# Patient Record
Sex: Male | Born: 1990 | Race: Asian | Hispanic: No | Marital: Single | Smoking: Never smoker
Health system: Southern US, Community
[De-identification: ages and names within clinical notes are randomized; demographics above are authoritative.]

---

## 2014-09-14 ENCOUNTER — Encounter (HOSPITAL_BASED_OUTPATIENT_CLINIC_OR_DEPARTMENT_OTHER): Payer: Self-pay | Admitting: *Deleted

## 2014-09-14 ENCOUNTER — Emergency Department (HOSPITAL_BASED_OUTPATIENT_CLINIC_OR_DEPARTMENT_OTHER)
Admission: EM | Admit: 2014-09-14 | Discharge: 2014-09-14 | Disposition: A | Discharge status: Home / Self Care | Payer: 59 | Attending: Emergency Medicine | Admitting: Emergency Medicine

## 2014-09-14 ENCOUNTER — Emergency Department (HOSPITAL_BASED_OUTPATIENT_CLINIC_OR_DEPARTMENT_OTHER): Payer: 59

## 2014-09-14 DIAGNOSIS — R112 Nausea with vomiting, unspecified: Secondary | ICD-10-CM | POA: Insufficient documentation

## 2014-09-14 DIAGNOSIS — R109 Unspecified abdominal pain: Secondary | ICD-10-CM | POA: Diagnosis present

## 2014-09-14 DIAGNOSIS — N200 Calculus of kidney: Secondary | ICD-10-CM

## 2014-09-14 DIAGNOSIS — N132 Hydronephrosis with renal and ureteral calculous obstruction: Secondary | ICD-10-CM | POA: Insufficient documentation

## 2014-09-14 LAB — BASIC METABOLIC PANEL
Anion gap: 9 (ref 5–15)
BUN: 12 mg/dL (ref 6–20)
CO2: 26 mmol/L (ref 22–32)
Calcium: 9.2 mg/dL (ref 8.9–10.3)
Chloride: 103 mmol/L (ref 101–111)
Creatinine, Ser: 1.13 mg/dL (ref 0.61–1.24)
GFR calc non Af Amer: >60 mL/min (ref >60)
GLUCOSE: 128 mg/dL — AB (ref 65–99)
Potassium: 4.1 mmol/L (ref 3.5–5.1)
Sodium: 138 mmol/L (ref 135–145)

## 2014-09-14 LAB — URINALYSIS, ROUTINE W REFLEX MICROSCOPIC
GLUCOSE, UA: NEGATIVE mg/dL
KETONES UR: 15 mg/dL — AB
Leukocytes, UA: NEGATIVE
NITRITE: NEGATIVE
Protein, ur: NEGATIVE mg/dL
Specific Gravity, Urine: 1.036 — ABNORMAL HIGH (ref 1.005–1.030)
UROBILINOGEN UA: 0.2 mg/dL (ref 0.0–1.0)
pH: 6 (ref 5.0–8.0)

## 2014-09-14 LAB — URINE MICROSCOPIC-ADD ON

## 2014-09-14 MED ORDER — OXYCODONE-ACETAMINOPHEN 5-325 MG PO TABS: 1.0000 | ORAL_TABLET | ORAL | Status: AC | PRN

## 2014-09-14 MED ORDER — KETOROLAC TROMETHAMINE 30 MG/ML IJ SOLN
30.0000 mg | Freq: Once | INTRAMUSCULAR | Status: AC
  Administered 2014-09-14: 30 mg via INTRAVENOUS
  Filled 2014-09-14: qty 1

## 2014-09-14 MED ORDER — TAMSULOSIN HCL 0.4 MG PO CAPS: 0.4000 mg | ORAL_CAPSULE | Freq: Every day | ORAL | Status: AC

## 2014-09-14 MED ORDER — OXYCODONE-ACETAMINOPHEN 5-325 MG PO TABS
1.0000 | ORAL_TABLET | Freq: Once | ORAL | Status: AC
  Administered 2014-09-14: 1 via ORAL
  Filled 2014-09-14: qty 1

## 2014-09-14 MED ORDER — ONDANSETRON 4 MG PO TBDP: ORAL_TABLET | ORAL | Status: AC

## 2014-09-14 NOTE — ED Notes (Signed)
D/c delayed d/t "consulting urology".

## 2014-09-14 NOTE — Discharge Instructions (Signed)

## 2014-09-14 NOTE — ED Provider Notes (Signed)
CSN: [id number]     Arrival date & time 09/14/14  1942 History  This chart was scribed for Travis BuccoMelanie Millan Legan, MD by Octavia HeirArianna Fisher, ED Scribe. This patient was seen in room MH01/MH01 and the patient's care was started at 8:48 PM.     Chief Complaint  Patient presents with  . Abdominal Pain      The history is provided by the patient. No language interpreter was used.    HPI Comments: Travis EaringChiragkumar Fisher is a 24 y.o. male who presents to the Emergency Department complaining of constant, gradual worsening abdominal pain onset 4 hours ago. Pt has associated difficulty urinating with urgency and nausea/vomiting. Pt reports having similar symptoms one week ago and went to urgent care and his tests came back negative. He denies hematuria.  History reviewed. No pertinent past medical history. History reviewed. No pertinent past surgical history. No family history on file. History  Substance Use Topics  . Smoking status: Never Smoker   . Smokeless tobacco: Not on file  . Alcohol Use: No    Review of Systems  Constitutional: Negative for fever, chills, diaphoresis and fatigue.  HENT: Negative for congestion, rhinorrhea and sneezing.   Eyes: Negative.   Respiratory: Negative for cough, chest tightness and shortness of breath.   Cardiovascular: Negative for chest pain and leg swelling.  Gastrointestinal: Positive for nausea, vomiting and abdominal pain. Negative for diarrhea and blood in stool.  Genitourinary: Positive for flank pain. Negative for frequency, hematuria and difficulty urinating.  Musculoskeletal: Positive for back pain. Negative for arthralgias.  Skin: Negative for rash.  Neurological: Negative for dizziness, speech difficulty, weakness, numbness and headaches.      Allergies  Review of patient's allergies indicates no known allergies.  Home Medications   Prior to Admission medications   Medication Sig Start Date End Date Taking? Authorizing Provider  ondansetron (ZOFRAN  ODT) 4 MG disintegrating tablet 4mg  ODT q4 hours prn nausea/vomit 09/14/14   Travis BuccoMelanie Daniyal Tabor, MD  oxyCODONE-acetaminophen (PERCOCET) 5-325 MG per tablet Take 1-2 tablets by mouth every 4 (four) hours as needed. 09/14/14   Travis BuccoMelanie Allayna Erlich, MD  tamsulosin (FLOMAX) 0.4 MG CAPS capsule Take 1 capsule (0.4 mg total) by mouth daily. 09/14/14   Travis BuccoMelanie Deondrae Mcgrail, MD   Triage vitals: BP 123/93 mmHg  Pulse 52  Temp(Src) 98.5 F (36.9 C) (Oral)  Resp 23  Ht 5\' 9"  (1.753 m)  Wt 138 lb (62.596 kg)  BMI 20.37 kg/m2  SpO2 100% Physical Exam  Constitutional: He is oriented to person, place, and time. He appears well-developed and well-nourished.  Patient appears uncomfortable and is pacing around in the room  HENT:  Head: Normocephalic and atraumatic.  Eyes: Pupils are equal, round, and reactive to light.  Neck: Normal range of motion. Neck supple.  Cardiovascular: Normal rate, regular rhythm and normal heart sounds.   Pulmonary/Chest: Effort normal and breath sounds normal. No respiratory distress. He has no wheezes. He has no rales. He exhibits no tenderness.  Abdominal: Soft. Bowel sounds are normal. There is tenderness (Moderate tenderness to the right midabdomen flank). There is no rebound and no guarding.  Musculoskeletal: Normal range of motion. He exhibits no edema.  Lymphadenopathy:    He has no cervical adenopathy.  Neurological: He is alert and oriented to person, place, and time.  Skin: Skin is warm and dry. No rash noted.  Psychiatric: He has a normal mood and affect.    ED Course  Procedures  DIAGNOSTIC STUDIES: Oxygen Saturation is 100% on  RA, normal by my interpretation.  COORDINATION OF CARE: 8:51 PM Discussed treatment plan which includes Toradol with pt at bedside and pt agreed to plan.  Labs Review Labs Reviewed  URINALYSIS, ROUTINE W REFLEX MICROSCOPIC (NOT AT [facility]) - Abnormal; Notable for the following:    Color, Urine AMBER (*)    APPearance CLOUDY (*)    Specific Gravity, Urine  1.036 (*)    Hgb urine dipstick LARGE (*)    Bilirubin Urine SMALL (*)    Ketones, ur 15 (*)    All other components within normal limits  URINE MICROSCOPIC-ADD ON - Abnormal; Notable for the following:    Bacteria, UA MANY (*)    All other components within normal limits  BASIC METABOLIC PANEL - Abnormal; Notable for the following:    Glucose, Bld 128 (*)    All other components within normal limits    Imaging Review Ct Renal Stone Study  09/14/2014   CLINICAL DATA:  Right flank pain for 4 hours.  EXAM: CT ABDOMEN AND PELVIS WITHOUT CONTRAST  TECHNIQUE: Multidetector CT imaging of the abdomen and pelvis was performed following the standard protocol without IV contrast.  COMPARISON:  None.  FINDINGS: The included lung bases are clear.  There is an obstructing 7 x 4 mm stone at the right ureterovesicular junction with moderate resultant hydroureteronephrosis. Minimal perinephric stranding. No definite additional nonobstructing stone in either kidney. Left kidney appears normal.  Evaluation of the remaining solid and hollow viscera is limited given lack of contrast. The unenhanced liver, spleen, pancreas, and adrenal glands are unremarkable.  Stomach is decompressed. There are no dilated or thickened bowel loops. The appendix is normal. Small volume of colonic stool without colonic wall thickening.  No retroperitoneal adenopathy. Abdominal aorta is normal in caliber.  Urinary bladder is near completely decompressed. No pelvic free fluid.  There are no acute or suspicious osseous abnormalities. Small scattered bone islands.  IMPRESSION: Obstructing 7 x 4 mm stone at the right ureterovesicular junction with resultant moderate hydroureteronephrosis.   Electronically Signed   By: Rubye Oaks M.D.   On: 09/14/2014 21:06     EKG Interpretation None      MDM   Final diagnoses:  Kidney stone    Patient with a large distal ureteral stone. He has moderate hydronephrosis. His creatinine is normal  but at the upper limits of normal. There is no evidence of infection. He was completely pain-free after 1 dose of Toradol. I spoke with Dr. Isabel Caprice who is fine with the patient being discharged home.  He was given prescription for Percocet Zofran and Flomax. He was also advised that he can use ibuprofen at home for symptomatic relief. He was advised that he does need close follow-up with urology. He was given a referral to [facility] and advised to call them in the morning for an appointment within the next few days.  He was given strict return precautions and advised to go to [facility] the ED if his symptoms worsen.  I personally performed the services described in this documentation, which was scribed in my presence.  The recorded information has been reviewed and considered.   Travis Bucco, MD 09/14/14 [phone]

## 2014-09-14 NOTE — ED Notes (Signed)
Pt here with right flank pain as well as vomiting which began around 4:30pm.  Pt is having dry heaves in triage

## 2017-05-04 ENCOUNTER — Other Ambulatory Visit: Payer: Self-pay

## 2017-05-04 ENCOUNTER — Encounter (HOSPITAL_BASED_OUTPATIENT_CLINIC_OR_DEPARTMENT_OTHER): Payer: Self-pay | Admitting: *Deleted

## 2017-05-04 ENCOUNTER — Emergency Department (HOSPITAL_BASED_OUTPATIENT_CLINIC_OR_DEPARTMENT_OTHER): Payer: BLUE CROSS/BLUE SHIELD

## 2017-05-04 ENCOUNTER — Emergency Department (HOSPITAL_BASED_OUTPATIENT_CLINIC_OR_DEPARTMENT_OTHER)
Admission: EM | Admit: 2017-05-04 | Discharge: 2017-05-04 | Disposition: A | Discharge status: Home / Self Care | Payer: BLUE CROSS/BLUE SHIELD | Attending: Emergency Medicine | Admitting: Emergency Medicine

## 2017-05-04 DIAGNOSIS — L03316 Cellulitis of umbilicus: Secondary | ICD-10-CM | POA: Insufficient documentation

## 2017-05-04 DIAGNOSIS — L0390 Cellulitis, unspecified: Secondary | ICD-10-CM

## 2017-05-04 DIAGNOSIS — R1031 Right lower quadrant pain: Secondary | ICD-10-CM

## 2017-05-04 LAB — CBC WITH DIFFERENTIAL/PLATELET
Basophils Absolute: 0 10*3/uL (ref 0.0–0.1)
Basophils Relative: 0 %
Eosinophils Absolute: 0.3 10*3/uL (ref 0.0–0.7)
Eosinophils Relative: 5 %
HCT: 42.3 % (ref 39.0–52.0)
Hemoglobin: 14.5 g/dL (ref 13.0–17.0)
Lymphocytes Relative: 27 %
Lymphs Abs: 1.6 10*3/uL (ref 0.7–4.0)
MCH: 28.7 pg (ref 26.0–34.0)
MCHC: 34.3 g/dL (ref 30.0–36.0)
MCV: 83.8 fL (ref 78.0–100.0)
Monocytes Absolute: 0.4 10*3/uL (ref 0.1–1.0)
Monocytes Relative: 8 %
Neutro Abs: 3.6 10*3/uL (ref 1.7–7.7)
Neutrophils Relative %: 60 %
Platelets: 206 10*3/uL (ref 150–400)
RBC: 5.05 MIL/uL (ref 4.22–5.81)
RDW: 12.9 % (ref 11.5–15.5)
WBC: 5.9 10*3/uL (ref 4.0–10.5)

## 2017-05-04 LAB — COMPREHENSIVE METABOLIC PANEL
ALT: 14 U/L — ABNORMAL LOW (ref 17–63)
AST: 21 U/L (ref 15–41)
Albumin: 4.4 g/dL (ref 3.5–5.0)
Alkaline Phosphatase: 60 U/L (ref 38–126)
Anion gap: 9 (ref 5–15)
BUN: 9 mg/dL (ref 6–20)
CO2: 27 mmol/L (ref 22–32)
Calcium: 9.4 mg/dL (ref 8.9–10.3)
Chloride: 100 mmol/L — ABNORMAL LOW (ref 101–111)
Creatinine, Ser: 1.01 mg/dL (ref 0.61–1.24)
GFR calc Af Amer: >60 mL/min (ref >60)
GFR calc non Af Amer: >60 mL/min (ref >60)
Glucose, Bld: 106 mg/dL — ABNORMAL HIGH (ref 65–99)
Potassium: 4.1 mmol/L (ref 3.5–5.1)
Sodium: 136 mmol/L (ref 135–145)
Total Bilirubin: 0.6 mg/dL (ref 0.3–1.2)
Total Protein: 7.6 g/dL (ref 6.5–8.1)

## 2017-05-04 LAB — URINALYSIS, ROUTINE W REFLEX MICROSCOPIC
Bilirubin Urine: NEGATIVE
Glucose, UA: NEGATIVE mg/dL
Hgb urine dipstick: NEGATIVE
Ketones, ur: NEGATIVE mg/dL
Leukocytes, UA: NEGATIVE
Nitrite: NEGATIVE
Protein, ur: NEGATIVE mg/dL
Specific Gravity, Urine: <1.005 — ABNORMAL LOW (ref 1.005–1.030)
pH: 7 (ref 5.0–8.0)

## 2017-05-04 LAB — LIPASE, BLOOD: Lipase: 32 U/L (ref 11–51)

## 2017-05-04 MED ORDER — IOPAMIDOL (ISOVUE-300) INJECTION 61%
100.0000 mL | Freq: Once | INTRAVENOUS | Status: AC | PRN
  Administered 2017-05-04: 100 mL via INTRAVENOUS

## 2017-05-04 NOTE — ED Provider Notes (Signed)
[facility] EMERGENCY DEPARTMENT Provider Note   CSN: [id number] Arrival date & time: 05/04/17  1300   History   Chief Complaint Chief Complaint  Patient presents with  . Abdominal Pain    HPI Travis Fisher is a 27 y.o. male.  HPI   27 year old male presents today with complaints of right lower quadrant abdominal pain.  Patient reports a several week history of redness around his umbilicus.  He notes there was some edema in that area as well, but had no significant pain.  He was seen in urgent care yesterday and started on Keflex.  He notes the redness has improved but has developed right lower quadrant abdominal pain.  He notes that symptoms are made worse with palpation or movement, but consistently present.  Patient denies any fever, nausea vomiting.  He denies any urinary changes, testicular pain or change in bowel characteristics or frequency; no testicular pain.  Patient has not taken any medications for pain prior to arrival.  He notes eating some crackers and tea this morning.   History reviewed. No pertinent past medical history.  There are no active problems to display for this patient.   History reviewed. No pertinent surgical history.     Home Medications    Prior to Admission medications   Medication Sig Start Date End Date Taking? Authorizing Provider  cephALEXin (KEFLEX) 250 MG capsule Take by mouth 4 (four) times daily.   Yes [provider]  ondansetron (ZOFRAN ODT) 4 MG disintegrating tablet 4mg  ODT q4 hours prn nausea/vomit 09/14/14   Rolan BuccoBelfi, Melanie, MD  oxyCODONE-acetaminophen (PERCOCET) 5-325 MG per tablet Take 1-2 tablets by mouth every 4 (four) hours as needed. 09/14/14   Rolan BuccoBelfi, Melanie, MD  tamsulosin (FLOMAX) 0.4 MG CAPS capsule Take 1 capsule (0.4 mg total) by mouth daily. 09/14/14   Rolan BuccoBelfi, Melanie, MD    Family History No family history on file.  Social History Social History   Tobacco Use  . Smoking status: Never Smoker  .  Smokeless tobacco: Never Used  Substance Use Topics  . Alcohol use: No  . Drug use: Not on file     Allergies   Patient has no known allergies.   Review of Systems Review of Systems  All other systems reviewed and are negative.    Physical Exam Updated Vital Signs BP 108/66 (BP Location: Left Arm)   Pulse 63   Temp 97.8 F (36.6 C) (Oral)   Resp 18   Ht 5\' 10"  (1.778 m)   Wt 69.9 kg (154 lb)   SpO2 99%   BMI 22.10 kg/m   Physical Exam  Constitutional: He is oriented to person, place, and time. He appears well-developed and well-nourished.  HENT:  Head: Normocephalic and atraumatic.  Eyes: Conjunctivae are normal. Pupils are equal, round, and reactive to light. Right eye exhibits no discharge. Left eye exhibits no discharge. No scleral icterus.  Neck: Normal range of motion. No JVD present. No tracheal deviation present.  Pulmonary/Chest: Effort normal. No stridor.  Abdominal:  Small amount of redness in umbilicus no surrounding edema or discharge, non tender- remained of abd exam normal without tenderness   Neurological: He is alert and oriented to person, place, and time. Coordination normal.  Psychiatric: He has a normal mood and affect. His behavior is normal. Judgment and thought content normal.  Nursing note and vitals reviewed.    ED Treatments / Results  Labs (all labs ordered are listed, but only abnormal results are displayed) Labs  Reviewed  COMPREHENSIVE METABOLIC PANEL - Abnormal; Notable for the following components:      Result Value   Chloride 100 (*)    Glucose, Bld 106 (*)    ALT 14 (*)    All other components within normal limits  URINALYSIS, ROUTINE W REFLEX MICROSCOPIC - Abnormal; Notable for the following components:   Specific Gravity, Urine <1.005 (*)    All other components within normal limits  CBC WITH DIFFERENTIAL/PLATELET  LIPASE, BLOOD    EKG  EKG Interpretation None       Radiology Ct Abdomen Pelvis W  Contrast  Result Date: 05/04/2017 CLINICAL DATA:  Lower abdominal pain and periumbilical swelling EXAM: CT ABDOMEN AND PELVIS WITH CONTRAST TECHNIQUE: Multidetector CT imaging of the abdomen and pelvis was performed using the standard protocol following bolus administration of intravenous contrast. Oral contrast was also administered. CONTRAST:  ISOVUE-300 IOPAMIDOL (ISOVUE-300) INJECTION 61% COMPARISON:  CT abdomen and pelvis September 14, 2014 FINDINGS: Lower chest: Lung bases are clear. Hepatobiliary: No focal liver lesions are evident. Gallbladder wall is not appreciably thickened. There is no biliary duct dilatation. Pancreas: No pancreatic mass or inflammatory focus. Spleen: No splenic lesions are evident. Adrenals/Urinary Tract: Adrenals bilaterally appear normal. Kidneys bilaterally show no appreciable mass or hydronephrosis on either side. There is an extrarenal pelvis on the left, an anatomic variant. No renal or ureteral calculus is appreciable. Urinary bladder is midline with wall thickness within normal limits. Stomach/Bowel: There is no appreciable bowel wall or mesenteric thickening. No evident bowel obstruction. No free air or portal venous air. Vascular/Lymphatic: There is no abdominal aortic aneurysm. No vascular lesions are evident in the abdomen or pelvis. There is no appreciable adenopathy in the abdomen or pelvis. Reproductive: Prostate and seminal vesicles appear normal in size and contour. No evident pelvic mass. Other: Appendix appears normal. No abscess or ascites is evident in the abdomen or pelvis. There is no periumbilical region lesion evident. Musculoskeletal: There are no blastic or lytic bone lesions. There is no intramuscular or abdominal wall lesion evident. IMPRESSION: 1. Appendix appears normal. No bowel obstruction. No abscess in the abdomen or pelvis. 2. No demonstrable renal or ureteral calculus on either side. No hydronephrosis. No urinary bladder wall thickening. 3.  No  periumbilical region lesion. 4. A cause for patient's symptoms has not been established with this study. Electronically Signed   By: Bretta Bang III M.D.   On: 05/04/2017 14:58    Procedures Procedures (including critical care time)  Medications Ordered in ED Medications  iopamidol (ISOVUE-300) 61 % injection 100 mL (100 mLs Intravenous Contrast Given 05/04/17 1444)     Initial Impression / Assessment and Plan / ED Course  I have reviewed the triage vital signs and the nursing notes.  Pertinent labs & imaging results that were available during my care of the patient were reviewed by me and considered in my medical decision making (see chart for details).     Final Clinical Impressions(s) / ED Diagnoses   Final diagnoses:  Right lower quadrant abdominal pain  Cellulitis, unspecified cellulitis site    Labs: UA, CBC, CMP, Lipase  Imaging: CT Abd pelvis with contrast   Consults:  Therapeutics:  Discharge Meds:   Assessment/Plan: 27 year old male presents today with complaints of abdominal pain.  Patient concerned that he potentially could have appendicitis.  He does have what appears to be very mild cellulitis of the umbilicus.  Patient has no tenderness to palpation right lower quadrant, but given  the cellulitis with progression of right lower quadrant abdominal pain CT scan ordered to rule out any acute intra-abdominal pathology.  CT returned showing no significant findings lab reassuring.  Patient denies testicular pain.  Patient encouraged to use over-the-counter medications, follow-up as an outpatient if symptoms persist return to the emergency room if they worsen.  He verbalized understanding and agreement to today's plan had no further questions or concerns at the time of discharge.    ED Discharge Orders    None       Rosalio Loud 05/04/17 1621    Shaune Pollack, MD 05/05/17 [phone]

## 2017-05-04 NOTE — ED Triage Notes (Signed)
Abdominal pain and swelling. [facility] red[facility] around his umbilicus. He was started on Keflex.

## 2017-05-04 NOTE — ED Notes (Signed)
Requested urine specimen.  Pt sts that he just has gone to the bathroom but he will try.

## 2017-05-04 NOTE — Discharge Instructions (Signed)
Please read attached information. If you experience any new or worsening signs or symptoms please return to the emergency room for evaluation. Please follow-up with your primary care provider or specialist as discussed.  Please continue using Keflex as previously prescribed.

## 2017-05-04 NOTE — ED Notes (Signed)
Patient transported to CT 

## 2017-05-04 NOTE — ED Notes (Signed)
Pt verbalizes understanding of d/c instructions and denies any further needs at this time. 

## 2019-07-25 IMAGING — CT CT ABD-PELV W/ CM
2 of 4 series · 16 of 46 positions shown, 18 images · IV contrast (APPLIED)
Comparison: CT abdomen and pelvis September 14, 2014

CLINICAL DATA: Lower abdominal pain and periumbilical swelling

EXAM:
CT ABDOMEN AND PELVIS WITH CONTRAST
TECHNIQUE: Multidetector CT imaging of the abdomen and pelvis was performed
using the standard protocol following bolus administration of
intravenous contrast. Oral contrast was also administered.
CONTRAST:  100mL CCJBW6-3VV IOPAMIDOL (CCJBW6-3VV) INJECTION 61%

[Series 2: axial st · axial · 0.75mm/px · z∈[+390,+830]mm · 13 of 96 slices shown, 15 images]
[im 4/96  soft-tissue]
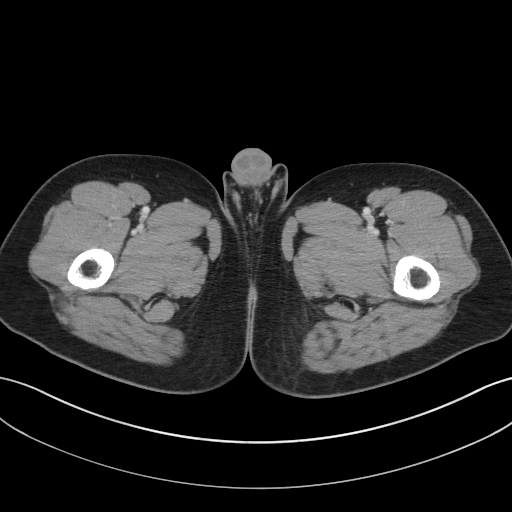
[im 4/96  bone]
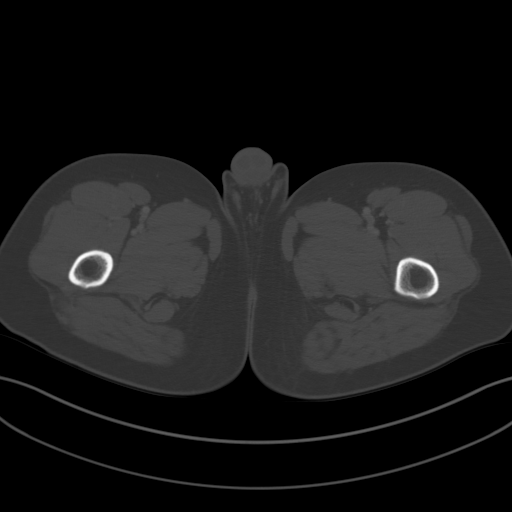
[im 12/96  soft-tissue]
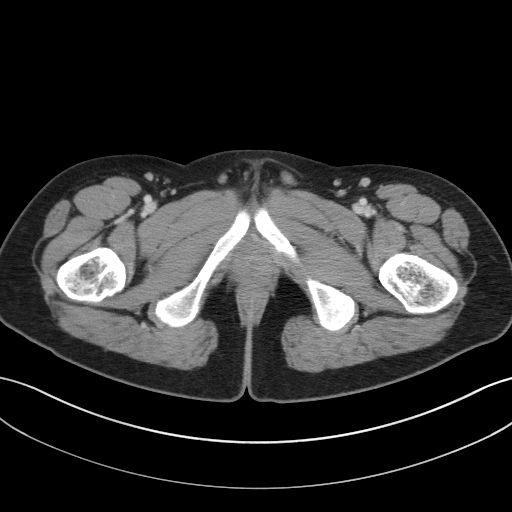
[im 20/96  soft-tissue]
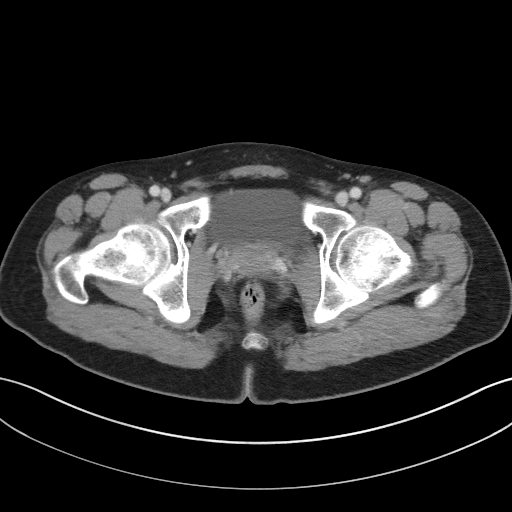
[im 28/96  soft-tissue]
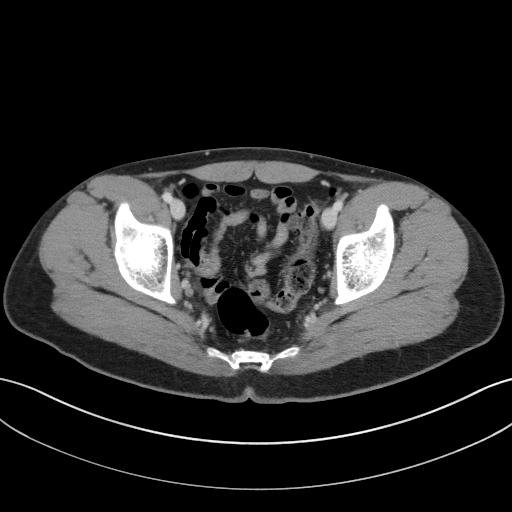
[im 32/96  soft-tissue]
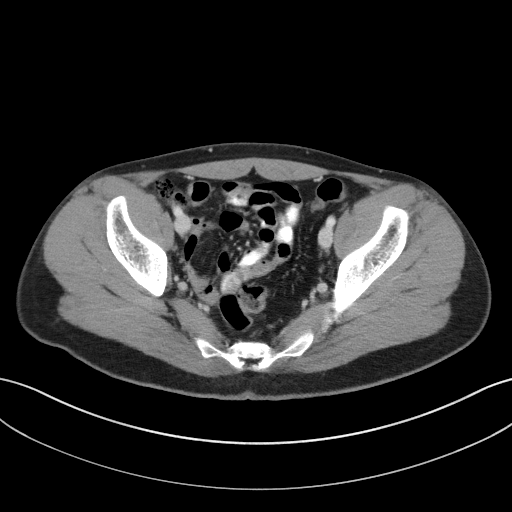
[im 40/96  soft-tissue]
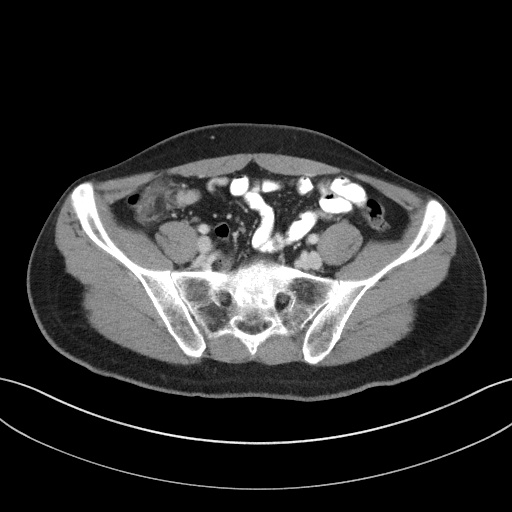
[im 48/96  soft-tissue]
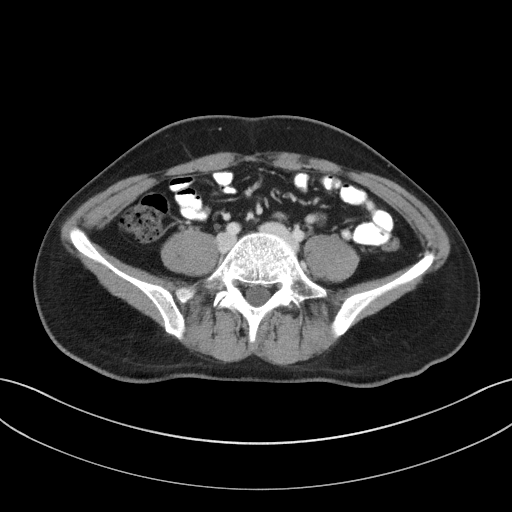
[im 56/96  soft-tissue]
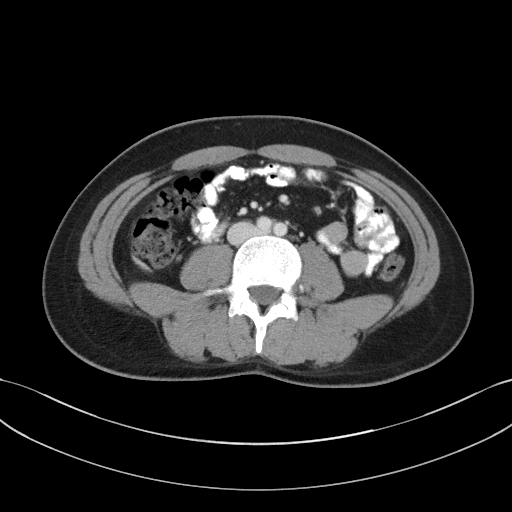
[im 64/96  soft-tissue]
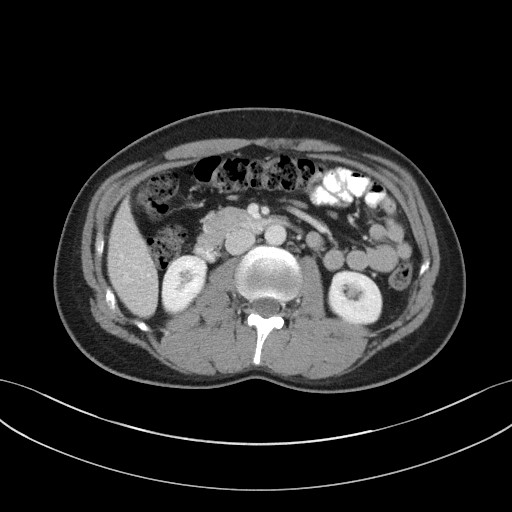
[im 64/96  bone]
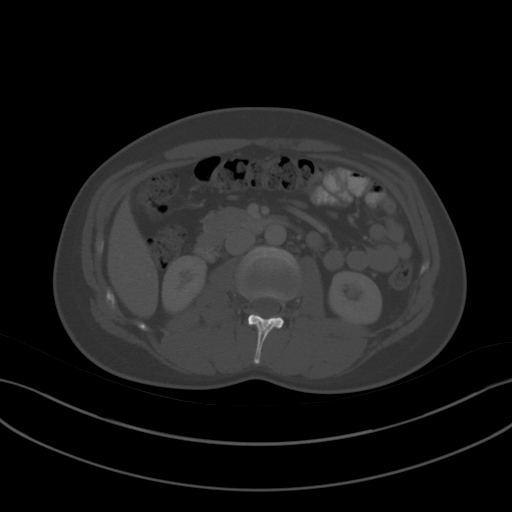
[im 68/96  soft-tissue]
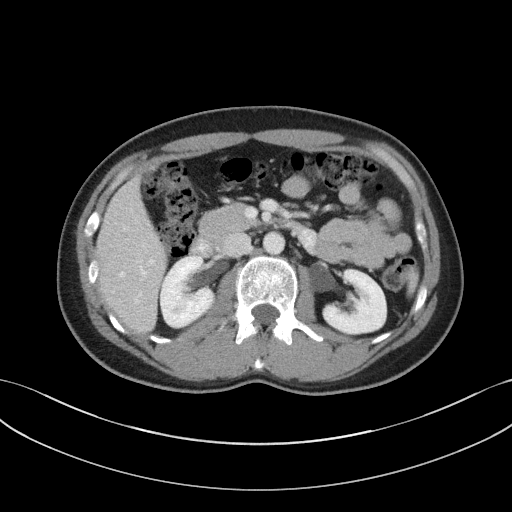
[im 76/96  soft-tissue]
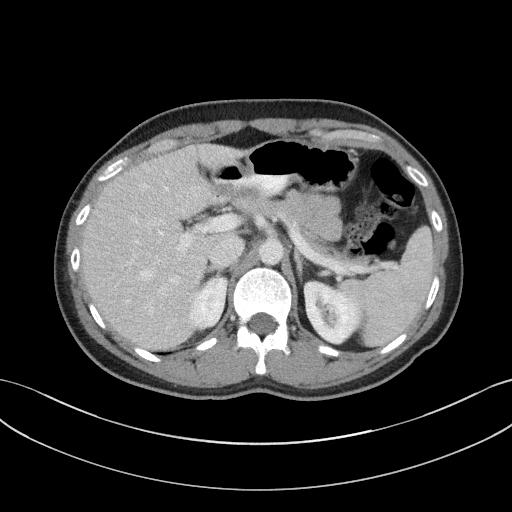
[im 84/96  soft-tissue]
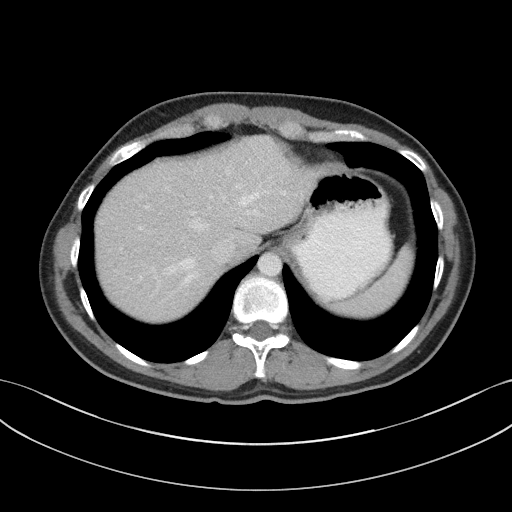
[im 92/96  soft-tissue]
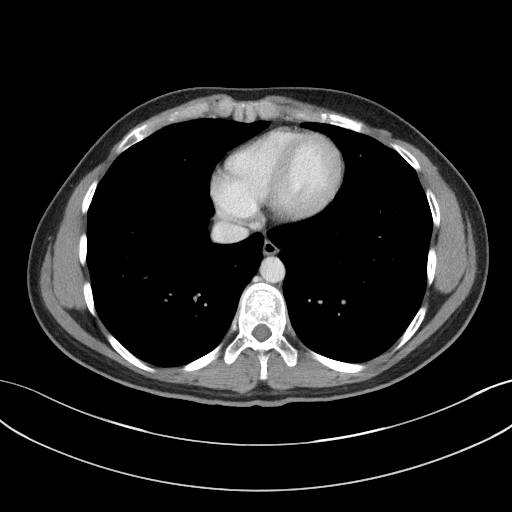

[Series 5: coronal st · coronal · 0.77mm/px · 3 of 80 slices shown]
[im 27/80  soft-tissue]
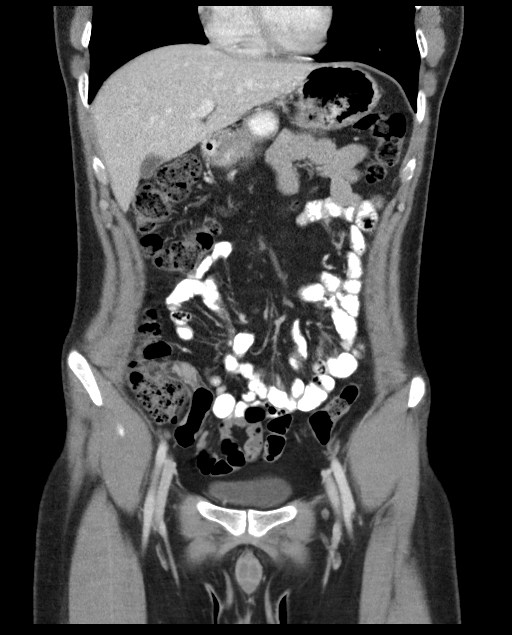
[im 36/80  soft-tissue]
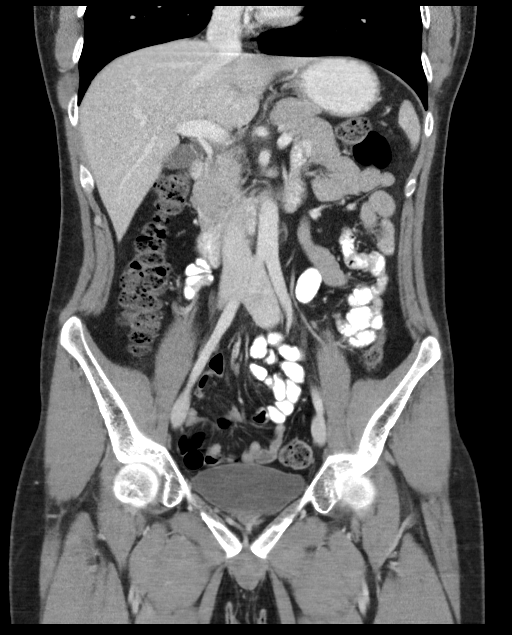
[im 44/80  soft-tissue]
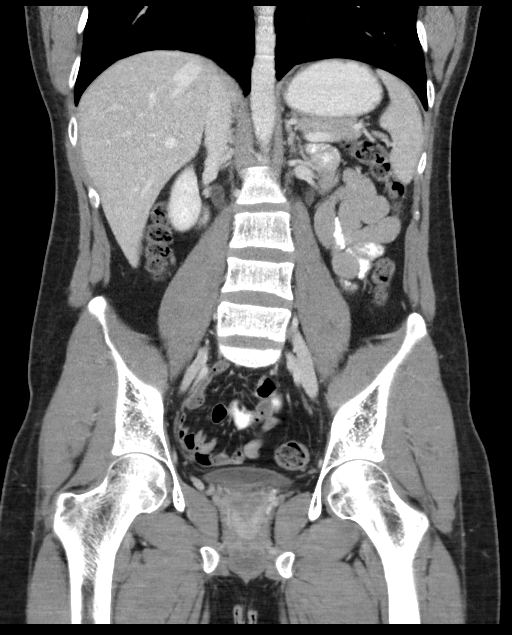

[16 of 46 positions shown; findings below may reference images not displayed]

FINDINGS: Lower chest: Lung bases are clear.

Hepatobiliary: No focal liver lesions are evident. Gallbladder wall
is not appreciably thickened. There is no biliary duct dilatation.

Pancreas: No pancreatic mass or inflammatory focus.

Spleen: No splenic lesions are evident.

Adrenals/Urinary Tract: Adrenals bilaterally appear normal. Kidneys
bilaterally show no appreciable mass or hydronephrosis on either
side. There is an extrarenal pelvis on the left, an anatomic
variant. No renal or ureteral calculus is appreciable. Urinary
bladder is midline with wall thickness within normal limits.

Stomach/Bowel: There is no appreciable bowel wall or mesenteric
thickening. No evident bowel obstruction. No free air or portal
venous air.

Vascular/Lymphatic: There is no abdominal aortic aneurysm. No
vascular lesions are evident in the abdomen or pelvis. There is no
appreciable adenopathy in the abdomen or pelvis.

Reproductive: Prostate and seminal vesicles appear normal in size
and contour. No evident pelvic mass.

Other: Appendix appears normal. No abscess or ascites is evident in
the abdomen or pelvis. There is no periumbilical region lesion
evident.

Musculoskeletal: There are no blastic or lytic bone lesions. There
is no intramuscular or abdominal wall lesion evident.
IMPRESSION: 1. Appendix appears normal. No bowel obstruction. No abscess in the
abdomen or pelvis.

2. No demonstrable renal or ureteral calculus on either side. No
hydronephrosis. No urinary bladder wall thickening.

3.  No periumbilical region lesion.

4. A cause for patient's symptoms has not been established with this
study.
